# Patient Record
Sex: Female | Born: 1980 | Race: White | Hispanic: Yes | Marital: Single | Smoking: Never smoker
Health system: Southern US, Community
[De-identification: ages and names within clinical notes are randomized; demographics above are authoritative.]

---

## 2008-11-19 ENCOUNTER — Inpatient Hospital Stay (HOSPITAL_COMMUNITY): Admission: AD | Admit: 2008-11-19 | Discharge: 2008-11-20 | Payer: Self-pay | Admitting: Obstetrics & Gynecology

## 2011-09-05 LAB — CBC
HCT: 31.3 % — ABNORMAL LOW (ref 36.0–46.0)
Hemoglobin: 11.6 g/dL — ABNORMAL LOW (ref 12.0–15.0)
MCHC: 33.2 g/dL (ref 30.0–36.0)
MCV: 82.2 fL (ref 78.0–100.0)
MCV: 82.7 fL (ref 78.0–100.0)
Platelets: 222 10*3/uL (ref 150–400)
RBC: 4.23 MIL/uL (ref 3.87–5.11)
WBC: 12.5 10*3/uL — ABNORMAL HIGH (ref 4.0–10.5)

## 2013-06-21 LAB — OB RESULTS CONSOLE RUBELLA ANTIBODY, IGM: Rubella: IMMUNE

## 2013-06-21 LAB — OB RESULTS CONSOLE RPR: RPR: NONREACTIVE

## 2013-06-21 LAB — OB RESULTS CONSOLE HIV ANTIBODY (ROUTINE TESTING): HIV: NONREACTIVE

## 2013-06-21 LAB — OB RESULTS CONSOLE ABO/RH: RH TYPE: POSITIVE

## 2013-06-21 LAB — OB RESULTS CONSOLE ANTIBODY SCREEN: ANTIBODY SCREEN: NEGATIVE

## 2013-06-21 LAB — CYTOLOGY - PAP: Pap: NEGATIVE

## 2013-06-21 LAB — OB RESULTS CONSOLE GC/CHLAMYDIA
CHLAMYDIA, DNA PROBE: NEGATIVE
GC PROBE AMP, GENITAL: NEGATIVE

## 2013-06-21 LAB — OB RESULTS CONSOLE HEPATITIS B SURFACE ANTIGEN: HEP B S AG: NEGATIVE

## 2013-12-01 NOTE — L&D Delivery Note (Signed)
Delivery Note At 4:09 PM a viable female was delivered via Vaginal, Spontaneous Delivery (Presentation: Left Occiput Anterior).  APGAR: 9, 9; weight .   Placenta status: , .  Cord: 3 vessels with the following complications: None.  Cord pH: not done  Anesthesia: None  Episiotomy: None Lacerations: 2nd degree Suture Repair: 2.0 vicryl Est. Blood Loss (mL):   Mom to postpartum.  Baby to Couplet care / Skin to Skin.  Anisah Kuck A 01/27/2014, 4:34 PM

## 2013-12-20 LAB — OB RESULTS CONSOLE GC/CHLAMYDIA
CHLAMYDIA, DNA PROBE: NEGATIVE
Gonorrhea: NEGATIVE

## 2013-12-20 LAB — OB RESULTS CONSOLE GBS: STREP GROUP B AG: NEGATIVE

## 2014-01-13 ENCOUNTER — Other Ambulatory Visit (HOSPITAL_COMMUNITY): Payer: Self-pay | Admitting: Obstetrics

## 2014-01-13 ENCOUNTER — Encounter (HOSPITAL_COMMUNITY): Payer: Self-pay

## 2014-01-13 ENCOUNTER — Ambulatory Visit (HOSPITAL_COMMUNITY)
Admission: RE | Admit: 2014-01-13 | Discharge: 2014-01-13 | Disposition: A | Discharge status: Home / Self Care | Payer: Self-pay | Source: Ambulatory Visit | Attending: Obstetrics | Admitting: Obstetrics

## 2014-01-13 DIAGNOSIS — Z111 Encounter for screening for respiratory tuberculosis: Secondary | ICD-10-CM

## 2014-01-13 DIAGNOSIS — R7611 Nonspecific reaction to tuberculin skin test without active tuberculosis: Secondary | ICD-10-CM | POA: Insufficient documentation

## 2014-01-24 ENCOUNTER — Telehealth (HOSPITAL_COMMUNITY): Payer: Self-pay | Admitting: *Deleted

## 2014-01-24 ENCOUNTER — Encounter (HOSPITAL_COMMUNITY): Payer: Self-pay | Admitting: *Deleted

## 2014-01-24 NOTE — Telephone Encounter (Signed)
Preadmission screen Interpreter number [phone]

## 2014-01-27 ENCOUNTER — Inpatient Hospital Stay (HOSPITAL_COMMUNITY)
Admission: RE | Admit: 2014-01-27 | Discharge: 2014-01-28 | DRG: 775 | Disposition: A | Discharge status: Home / Self Care | Payer: Medicaid Other | Source: Ambulatory Visit | Attending: Obstetrics | Admitting: Obstetrics

## 2014-01-27 ENCOUNTER — Encounter (HOSPITAL_COMMUNITY): Payer: Self-pay

## 2014-01-27 DIAGNOSIS — Z3490 Encounter for supervision of normal pregnancy, unspecified, unspecified trimester: Secondary | ICD-10-CM

## 2014-01-27 LAB — RPR: RPR Ser Ql: NONREACTIVE

## 2014-01-27 LAB — CBC
HCT: 32.3 % — ABNORMAL LOW (ref 36.0–46.0)
HEMOGLOBIN: 10.5 g/dL — AB (ref 12.0–15.0)
MCH: 24 pg — ABNORMAL LOW (ref 26.0–34.0)
MCHC: 32.5 g/dL (ref 30.0–36.0)
MCV: 73.9 fL — ABNORMAL LOW (ref 78.0–100.0)
PLATELETS: 269 10*3/uL (ref 150–400)
RBC: 4.37 MIL/uL (ref 3.87–5.11)
RDW: 15.5 % (ref 11.5–15.5)
WBC: 8.1 10*3/uL (ref 4.0–10.5)

## 2014-01-27 LAB — TYPE AND SCREEN
ABO/RH(D): A POS
ANTIBODY SCREEN: NEGATIVE

## 2014-01-27 LAB — ABO/RH: ABO/RH(D): A POS

## 2014-01-27 MED ORDER — TETANUS-DIPHTH-ACELL PERTUSSIS 5-2.5-18.5 LF-MCG/0.5 IM SUSP
0.5000 mL | Freq: Once | INTRAMUSCULAR | Status: AC
  Administered 2014-01-28: 0.5 mL via INTRAMUSCULAR
  Filled 2014-01-27: qty 0.5

## 2014-01-27 MED ORDER — ACETAMINOPHEN 325 MG PO TABS: 650.0000 mg | ORAL_TABLET | ORAL | Status: DC | PRN

## 2014-01-27 MED ORDER — SENNOSIDES-DOCUSATE SODIUM 8.6-50 MG PO TABS
2.0000 | ORAL_TABLET | ORAL | Status: DC
  Administered 2014-01-27: 2 via ORAL
  Filled 2014-01-27: qty 2

## 2014-01-27 MED ORDER — ZOLPIDEM TARTRATE 5 MG PO TABS: 5.0000 mg | ORAL_TABLET | Freq: Every evening | ORAL | Status: DC | PRN

## 2014-01-27 MED ORDER — IBUPROFEN 600 MG PO TABS
600.0000 mg | ORAL_TABLET | Freq: Four times a day (QID) | ORAL | Status: DC
  Administered 2014-01-27 – 2014-01-28 (×3): 600 mg via ORAL
  Filled 2014-01-27 (×3): qty 1

## 2014-01-27 MED ORDER — SIMETHICONE 80 MG PO CHEW: 80.0000 mg | CHEWABLE_TABLET | ORAL | Status: DC | PRN

## 2014-01-27 MED ORDER — TERBUTALINE SULFATE 1 MG/ML IJ SOLN: 0.2500 mg | Freq: Once | INTRAMUSCULAR | Status: DC | PRN

## 2014-01-27 MED ORDER — FERROUS SULFATE 325 (65 FE) MG PO TABS
325.0000 mg | ORAL_TABLET | Freq: Two times a day (BID) | ORAL | Status: DC
  Administered 2014-01-28 (×2): 325 mg via ORAL
  Filled 2014-01-27: qty 1

## 2014-01-27 MED ORDER — IBUPROFEN 600 MG PO TABS
600.0000 mg | ORAL_TABLET | Freq: Four times a day (QID) | ORAL | Status: DC | PRN
  Administered 2014-01-27: 600 mg via ORAL
  Filled 2014-01-27: qty 1

## 2014-01-27 MED ORDER — BUTORPHANOL TARTRATE 1 MG/ML IJ SOLN: 1.0000 mg | INTRAMUSCULAR | Status: DC | PRN

## 2014-01-27 MED ORDER — ONDANSETRON HCL 4 MG/2ML IJ SOLN: 4.0000 mg | Freq: Four times a day (QID) | INTRAMUSCULAR | Status: DC | PRN

## 2014-01-27 MED ORDER — OXYTOCIN 40 UNITS IN LACTATED RINGERS INFUSION - SIMPLE MED
1.0000 m[IU]/min | INTRAVENOUS | Status: DC
  Administered 2014-01-27: 2 m[IU]/min via INTRAVENOUS
  Filled 2014-01-27: qty 1000

## 2014-01-27 MED ORDER — CITRIC ACID-SODIUM CITRATE 334-500 MG/5ML PO SOLN: 30.0000 mL | ORAL | Status: DC | PRN

## 2014-01-27 MED ORDER — OXYTOCIN 40 UNITS IN LACTATED RINGERS INFUSION - SIMPLE MED: 62.5000 mL/h | INTRAVENOUS | Status: DC

## 2014-01-27 MED ORDER — ONDANSETRON HCL 4 MG/2ML IJ SOLN: 4.0000 mg | INTRAMUSCULAR | Status: DC | PRN

## 2014-01-27 MED ORDER — OXYTOCIN BOLUS FROM INFUSION: 500.0000 mL | INTRAVENOUS | Status: DC

## 2014-01-27 MED ORDER — LACTATED RINGERS IV SOLN
INTRAVENOUS | Status: DC
  Administered 2014-01-27 (×2): via INTRAVENOUS

## 2014-01-27 MED ORDER — INFLUENZA VAC SPLIT QUAD 0.5 ML IM SUSP
0.5000 mL | INTRAMUSCULAR | Status: AC
  Administered 2014-01-28: 0.5 mL via INTRAMUSCULAR

## 2014-01-27 MED ORDER — DIPHENHYDRAMINE HCL 25 MG PO CAPS: 25.0000 mg | ORAL_CAPSULE | Freq: Four times a day (QID) | ORAL | Status: DC | PRN

## 2014-01-27 MED ORDER — PRENATAL MULTIVITAMIN CH
1.0000 | ORAL_TABLET | Freq: Every day | ORAL | Status: DC
Start: 2014-01-28 — End: 2014-01-28
  Administered 2014-01-28: 1 via ORAL
  Filled 2014-01-27: qty 1

## 2014-01-27 MED ORDER — DIBUCAINE 1 % RE OINT: 1.0000 "application " | TOPICAL_OINTMENT | RECTAL | Status: DC | PRN

## 2014-01-27 MED ORDER — BENZOCAINE-MENTHOL 20-0.5 % EX AERO
1.0000 "application " | INHALATION_SPRAY | CUTANEOUS | Status: DC | PRN
  Administered 2014-01-27: 1 via TOPICAL
  Filled 2014-01-27: qty 56

## 2014-01-27 MED ORDER — SODIUM CHLORIDE 0.9 % IJ SOLN: 3.0000 mL | INTRAMUSCULAR | Status: DC | PRN

## 2014-01-27 MED ORDER — WITCH HAZEL-GLYCERIN EX PADS: 1.0000 "application " | MEDICATED_PAD | CUTANEOUS | Status: DC | PRN

## 2014-01-27 MED ORDER — LIDOCAINE HCL (PF) 1 % IJ SOLN
30.0000 mL | INTRAMUSCULAR | Status: DC | PRN
  Administered 2014-01-27: 30 mL via SUBCUTANEOUS
  Filled 2014-01-27: qty 30

## 2014-01-27 MED ORDER — OXYCODONE-ACETAMINOPHEN 5-325 MG PO TABS
1.0000 | ORAL_TABLET | ORAL | Status: DC | PRN
  Administered 2014-01-27: 1 via ORAL
  Administered 2014-01-28: 2 via ORAL
  Filled 2014-01-27: qty 2
  Filled 2014-01-27: qty 1

## 2014-01-27 MED ORDER — ONDANSETRON HCL 4 MG PO TABS
4.0000 mg | ORAL_TABLET | ORAL | Status: DC | PRN
  Administered 2014-01-28: 4 mg via ORAL
  Filled 2014-01-27: qty 1

## 2014-01-27 MED ORDER — LACTATED RINGERS IV SOLN: 500.0000 mL | INTRAVENOUS | Status: DC | PRN

## 2014-01-27 MED ORDER — LANOLIN HYDROUS EX OINT: TOPICAL_OINTMENT | CUTANEOUS | Status: DC | PRN

## 2014-01-27 MED ORDER — OXYCODONE-ACETAMINOPHEN 5-325 MG PO TABS
1.0000 | ORAL_TABLET | ORAL | Status: DC | PRN
  Administered 2014-01-27: 1 via ORAL
  Filled 2014-01-27: qty 1

## 2014-01-27 MED ORDER — FLEET ENEMA 7-19 GM/118ML RE ENEM: 1.0000 | ENEMA | RECTAL | Status: DC | PRN

## 2014-01-27 NOTE — H&P (Signed)
This is Dr. Bernard Marshall dictating the hisFrancoise Murray and physical on  Shelly Murray she's a 33 year old gravida 3 para 2 all to at 40 weeks and 6 [facility] 01/21/1950 [facility] G[facility] admitted in for induction today cervix is 2 cm him in rapid progress a had a normal vaginal delivery of a female Apgar 3899 a second-degree perineal and the placenta was spontaneous Past medical history negative Past surgical history negative Social history negative System review noncontributory Physical exam well-developed female in no distress Lungs clear to P&A Heart regular rhythm no murmurs no gallops Breasts negative Abdomen uterus 20 week postpartum signs Extremities negative and

## 2014-01-28 LAB — CBC
HEMATOCRIT: 31.3 % — AB (ref 36.0–46.0)
HEMOGLOBIN: 10 g/dL — AB (ref 12.0–15.0)
MCH: 23.7 pg — AB (ref 26.0–34.0)
MCHC: 31.9 g/dL (ref 30.0–36.0)
MCV: 74.2 fL — ABNORMAL LOW (ref 78.0–100.0)
Platelets: 255 10*3/uL (ref 150–400)
RBC: 4.22 MIL/uL (ref 3.87–5.11)
RDW: 15.7 % — ABNORMAL HIGH (ref 11.5–15.5)
WBC: 15.3 10*3/uL — ABNORMAL HIGH (ref 4.0–10.5)

## 2014-01-28 NOTE — Lactation Note (Signed)
This note was copied from the chart of Shelly Murray. Lactation Consultation Note Initial consult:  [name] 3023 hours old.  Experienced BF mother for 1 and 2 years. In house interpreter unable to be present so [employer] Interpreter called.  Baby has been mostly formula fed and has not breastfed since this morning.  Described supply and demand and encouraged mother to her to put baby to the breast 8-12 times a day. Mother states she has been taught hand expression by her nurse.  Reviewed basics, feeding cues, cluster feeding, engorgement care, lactation support services and brochure.  Mother denies pain or problems or questions.  Encouraged mother to call if assistance is needed.  Patient Name: Shelly Murray [id number]'s Date: 01/28/2014 Reason for consult: Initial assessment   Maternal Data Has patient been taught Hand Expression?: Yes Does the patient have breastfeeding experience prior to this delivery?: Yes  Feeding    LATCH Score/Interventions                      Lactation Tools Discussed/Used     Consult Status Consult Status: PRN    Hardie PulleyBerkelhammer, Ruth Boschen 01/28/2014, 3:11 PM

## 2014-01-28 NOTE — Discharge Summary (Signed)
Obstetric Discharge Summary Reason for Admission: induction of labor Prenatal Procedures: none Intrapartum Procedures: spontaneous vaginal delivery Postpartum Procedures: none Complications-Operative and Postpartum: none Hemoglobin  Date Value Ref Range Status  01/28/2014 10.0* 12.0 - 15.0 g/dL Final     HCT  Date Value Ref Range Status  01/28/2014 31.3* 36.0 - 46.0 % Final    Physical Exam:  General: alert Lochia: appropriate Uterine Fundus: firm Incision: healing well DVT Evaluation: No evidence of DVT seen on physical exam.  Discharge Diagnoses: Term Pregnancy-delivered  Discharge Information: Date: 01/28/2014 Activity: pelvic rest Diet: routine Medications: Percocet Condition: stable Instructions: refer to practice specific booklet Discharge to: home Follow-up Information   Follow up with Kathreen CosierMARSHALL,BERNARD A, MD.   Specialty:  Obstetrics and Gynecology   Contact information:   [address] [place] [address] [phone]       Newborn Data: Live born female  Birth Weight: 7 lb 10.2 oz (3464 g) APGAR: 9, 9  Home with mother.  MARSHALL,BERNARD A 01/28/2014, 7:48 AM

## 2014-01-28 NOTE — Discharge Instructions (Signed)
Discharge instructions ° °· You can wash your hair °· Shower °· Eat what you want °· Drink what you want °· See me in 6 weeks °· Your ankles are going to swell more in the next 2 weeks than when pregnant °· No sex for 6 weeks ° ° °Serafin Decatur A, MD 01/28/2014 ° ° °

## 2014-01-30 NOTE — Progress Notes (Signed)
Ur chart review completed.  

## 2014-02-03 ENCOUNTER — Encounter (HOSPITAL_COMMUNITY): Payer: Self-pay | Admitting: Emergency Medicine

## 2014-02-03 ENCOUNTER — Emergency Department (HOSPITAL_COMMUNITY)
Admission: EM | Admit: 2014-02-03 | Discharge: 2014-02-03 | Disposition: A | Discharge status: Home / Self Care | Payer: Medicaid Other | Attending: Emergency Medicine | Admitting: Emergency Medicine

## 2014-02-03 DIAGNOSIS — R300 Dysuria: Secondary | ICD-10-CM | POA: Insufficient documentation

## 2014-02-03 DIAGNOSIS — O909 Complication of the puerperium, unspecified: Principal | ICD-10-CM

## 2014-02-03 DIAGNOSIS — K5900 Constipation, unspecified: Secondary | ICD-10-CM | POA: Insufficient documentation

## 2014-02-03 DIAGNOSIS — R339 Retention of urine, unspecified: Secondary | ICD-10-CM | POA: Insufficient documentation

## 2014-02-03 DIAGNOSIS — O26899 Other specified pregnancy related conditions, unspecified trimester: Secondary | ICD-10-CM | POA: Insufficient documentation

## 2014-02-03 MED ORDER — POLYETHYLENE GLYCOL 3350 17 G PO PACK: 17.0000 g | PACK | Freq: Every day | ORAL | Status: AC

## 2014-02-03 MED ORDER — POLYETHYLENE GLYCOL 3350 17 G PO PACK
17.0000 g | PACK | Freq: Every day | ORAL | Status: DC
  Administered 2014-02-03: 17 g via ORAL
  Filled 2014-02-03 (×2): qty 1

## 2014-02-03 MED ORDER — ACETAMINOPHEN 325 MG PO TABS
650.0000 mg | ORAL_TABLET | Freq: Once | ORAL | Status: AC
  Administered 2014-02-03: 650 mg via ORAL
  Filled 2014-02-03: qty 2

## 2014-02-03 MED ORDER — FLEET ENEMA 7-19 GM/118ML RE ENEM
1.0000 | ENEMA | Freq: Once | RECTAL | Status: AC
  Administered 2014-02-03: 1 via RECTAL
  Filled 2014-02-03: qty 1

## 2014-02-03 MED ORDER — LIDOCAINE HCL 2 % EX GEL
Freq: Once | CUTANEOUS | Status: AC
  Administered 2014-02-03: 07:00:00
  Filled 2014-02-03: qty 10

## 2014-02-03 NOTE — ED Notes (Signed)
Patient states she has been unable to have a BM for 3 days, and has been unable to urinate x2 days. Patient states the only medication she is taking is ibuprofen.

## 2014-02-03 NOTE — ED Notes (Signed)
Pt Voided urine in bed, Ambulated to restroom for defecation

## 2014-02-03 NOTE — ED Notes (Signed)
Patient escorted to restroom. Patient able to void.

## 2014-02-03 NOTE — ED Provider Notes (Signed)
Medical screening examination/treatment/procedure(s) were performed by non-physician practitioner and as supervising physician I was immediately available for consultation/collaboration.   EKG Interpretation None       Olivia Mackielga M Ezell Poke, MD 02/03/14 [phone]

## 2014-02-03 NOTE — Progress Notes (Signed)
[facility] spoke with patient about resources in the community to help pt establish primary care. Patient stated that she was pending Medicaid.

## 2014-02-03 NOTE — Discharge Instructions (Signed)
Constipación - Adulto   (Constipation, Adult)   Constipación significa que una persona tiene menos de 3 evacuaciones en una semana, hay dificultad para evacuar el intestino, o las heces son secas, duras, o más grandes que lo normal. A medida que envejecemos el estreñimiento es más común. Si intenta curar el estreñimiento con medicamentos que producen la evacuación intestinal (laxantes), el problema puede empeorar. El uso prolongado de laxantes puede hacer que los músculos del colon se debiliten. Una dieta baja en fibra, no tomar suficientes líquidos y el uso de ciertos medicamentos pueden empeorar el estreñimiento.   CAUSAS   · Ciertos medicamentos, como los antidepresivos, analgésicos, suplementos de hierro, antiácidos y diuréticos.    · Algunas enfermedades, como la diabetes, el síndrome del colon irritable (SII), enfermedad de la tiroides, o depresión.    · No beber suficiente agua.    · No consumir suficientes alimentos ricos en fibra.    · Situaciones de estrés o viajes.  · Falta de actividad física o de ejercicio.  · No ir al baño cuando siente la necesidad.  · Ignorar la necesidad súbita de mover el intestino.  · Uso en exceso de laxantes.  SÍNTOMAS   · Evacuar el intestino menos de 3 veces a la semana.    · Dificultad para mover el intestino    · Tener las heces secas y duras, o más grandes que las normales.    · Sensación de estar lleno o distendido.    · Dolor en la parte baja del abdomen  · No se siente alivio después de evacuar el intestino.  DIAGNÓSTICO   El médico le hará una historia clínica y le hará un examen físico. Pueden hacerle exámenes adicionales para el estreñimiento grave. Algunas pruebas son:   · Un radiografía con enema de bario para examinar el recto, el colon y en algunos casos el intestino delgado.  · Una sigmoidoscopia para examinar el colon inferior.  · Una colonoscopia para examinar todo el colon.  TRATAMIENTO   El tratamiento dependerá de la gravedad de la constipación y de la  causa. Algunos tratamientos dietéticos son beber más líquidos y comer más alimentos ricos en fibra. El cambio en el estilo de vida incluye hacer ejercicios de manera regular. Si estas recomendaciones para realizar cambios en la dieta y en el estilo de vida no ayudan, el médico le puede indicar el uso de laxantes de venta libre para favorecer el movimiento intestinal. Los medicamentos con receta se pueden prescribir si los medicamentos de venta libre no lo mejoran.   INSTRUCCIONES PARA EL CUIDADO EN EL HOGAR   · Aumente el consumo de alimentos con fibra, como frutas, verduras, granos enteros y frijoles. Limite los azúcares ricos en grasas y procesados   en su dieta, tales como papas fritas, hamburguesas, galletas, dulces y refrescos.    · Puede agregar un suplemento de fibra a su dieta si no obtiene lo suficiente de los alimentos.    · Debe ingerir gran cantidad de líquido para mantener la orina de tono claro o color amarillo pálido.    · Haga ejercicios regularmente o según las indicaciones de su médico.    · Vaya al baño cuando sienta la necesidad de ir. No espere.  · Tome sólo la medicación que le indicó el profesional.  No tome otros medicamentos para la constipación sin consultar a su médico.  SOLICITE ATENCIÓN MÉDICA DE INMEDIATO SI:   · Observa sangre brillante en las heces.    · La constipación dura más de 4 días o empeora.    · Siente   dolor abdominal o rectal.    · Las heces son delgadas como un lápiz.  · Pierde peso de manera inexplicable.  ASEGÚRESE DE QUE:   · Comprende estas instrucciones.  · Controlará su enfermedad.  · Solicitará ayuda de inmediato si no mejora o empeora.  Document Released: 12/07/2007 Document Revised: 05/18/2012  Exit[employer]® Patient Information ©2014 [employer].

## 2014-02-03 NOTE — ED Provider Notes (Signed)
CSN: [id number]     Arrival date & time 02/03/14  0540 History   First MD Initiated Contact with Patient 02/03/14 [phone]     Chief Complaint  Patient presents with  . Constipation    x3 days     (Consider location/radiation/quality/duration/timing/severity/associated sxs/prior Treatment) HPI  33 year old Spanish speaking female 1 week postpartum who presents for complaints of lack of bowel movement and unable to urinate for the past several days. Hx obtain through son who is at bedside.  Patient is a G3 P3 with recent normal vaginal delivery without any complication. Patient has a small bowel movement 2 days after giving birth, none since. She has the urge to have a bowel movement but felt constipated. She complains of crampy abdominal pain worse with palpation to the abdomen. She also has the urge to urinate but has urinate minimally.  She did produce a small amount of urine while in ER.  Otherwise patient denies fever, chills, vomiting, burning with urination. She denies receiving epidural for her pregnancy.  She did not have similar sxs with prior birth.     History reviewed. No pertinent past medical history. History reviewed. No pertinent past surgical history. Family History  Problem Relation Age of Onset  . Asthma Daughter   . Alcohol abuse Neg Hx   . Arthritis Neg Hx   . Birth defects Neg Hx   . Cancer Neg Hx   . COPD Neg Hx   . Depression Neg Hx   . Diabetes Neg Hx   . Drug abuse Neg Hx   . Early death Neg Hx   . Hearing loss Neg Hx   . Heart disease Neg Hx   . Hyperlipidemia Neg Hx   . Hypertension Neg Hx   . Kidney disease Neg Hx   . Learning disabilities Neg Hx   . Mental illness Neg Hx   . Mental retardation Neg Hx   . Miscarriages / Stillbirths Neg Hx   . Stroke Neg Hx   . Vision loss Neg Hx    History  Substance Use Topics  . Smoking status: Never Smoker   . Smokeless tobacco: Never Used  . Alcohol Use: No   OB History   Grav Para Term Preterm Abortions TAB  SAB Ect Mult Living   3 3 3       3      Review of Systems  Gastrointestinal: Positive for abdominal pain and constipation. Negative for blood in stool, anal bleeding and rectal pain.  Genitourinary: Positive for difficulty urinating.  Skin: Negative for rash and wound.  All other systems reviewed and are negative.      Allergies  Review of patient's allergies indicates no known allergies.  Home Medications   Current Outpatient Rx  Name  Route  Sig  Dispense  Refill  . ibuprofen (ADVIL,MOTRIN) 200 MG tablet   Oral   Take 600 mg by mouth every 6 (six) hours as needed for mild pain.          BP 116/69  Pulse 100  Temp(Src) 98.1 F (36.7 C) (Oral)  Resp 18  Ht 5' (1.524 m)  Wt 153 lb (69.4 kg)  BMI 29.88 kg/m2  SpO2 100% Physical Exam  Nursing note and vitals reviewed. Constitutional: She appears well-developed and well-nourished. No distress.  HENT:  Head: Atraumatic.  Eyes: Conjunctivae are normal.  Neck: Neck supple.  Abdominal: Soft. There is tenderness (suprapubic and LLQ abd tenderness, mild, without guarding or rebound tenderness.  no  hernia noted).  Genitourinary:  Chaperone present: large stool burden in rectal vault, soft but impacted.    Neurological: She is alert.  Skin: No rash noted.  Psychiatric: She has a normal mood and affect.    ED Course  Fecal disimpaction Date/Time: 02/03/2014 7:40 AM Performed by: Fayrene Helper Authorized by: Fayrene Helper Consent: Verbal consent obtained. Risks and benefits: risks, benefits and alternatives were discussed Consent given by: patient Patient understanding: patient states understanding of the procedure being performed Patient identity confirmed: verbally with patient and arm band Local anesthesia used: yes Local anesthetic: topical anesthetic Anesthetic total: 4 ml Patient tolerance: Patient tolerated the procedure well with no immediate complications. Comments: Manual disimpaction with removal of  approximately 1 egg size ball of stool.     (including critical care time)  6:26 AM Pt hasn't had BM in 3 days and unable to urinate x 2 days.  Does have large stool burden in rectal vault, and now having urge to have BM.  She was able to produce some urine while being in ER.    7:40 AM Manual disimpaction performed by me to help aid BM.  Miralax given.    8:27 AM Patient was able to urinate in the ED.  9:39 AM Patient had a large bowel movement and also able to urinate completely. Patient states she felt much better. She does have some mild vaginal bleeding but this has been residual from her recent vaginal delivery less than a week ago. No new bleeding. Patient is currently stable for discharge. Return precautions discussed.  Labs Review Labs Reviewed - No data to display Imaging Review No results found.   EKG Interpretation None      MDM   Final diagnoses:  Constipation  Urinary retention    BP 116/69  Pulse 100  Temp(Src) 98.1 F (36.7 C) (Oral)  Resp 18  Ht 5' (1.524 m)  Wt 153 lb (69.4 kg)  BMI 29.88 kg/m2  SpO2 100%     Fayrene Helper, PA-C 02/03/14 [phone]

## 2014-02-03 NOTE — ED Notes (Signed)
Per EMS, patient c/o constipation x3 days, denies abd pain, nausea, and vomiting. Patient delivered a baby @ 1 week ago, minimal bleeding since then. Patient does not speak [place].

## 2014-10-02 ENCOUNTER — Encounter (HOSPITAL_COMMUNITY): Payer: Self-pay | Admitting: Emergency Medicine

## 2015-06-19 IMAGING — CR DG CHEST 1V
1 series · 1 of 1 positions shown · non-contrast
Comparison: None.

CLINICAL DATA: Asymptomatic positive PPD.

EXAM:
CHEST - 1 VIEW

[view not recorded]
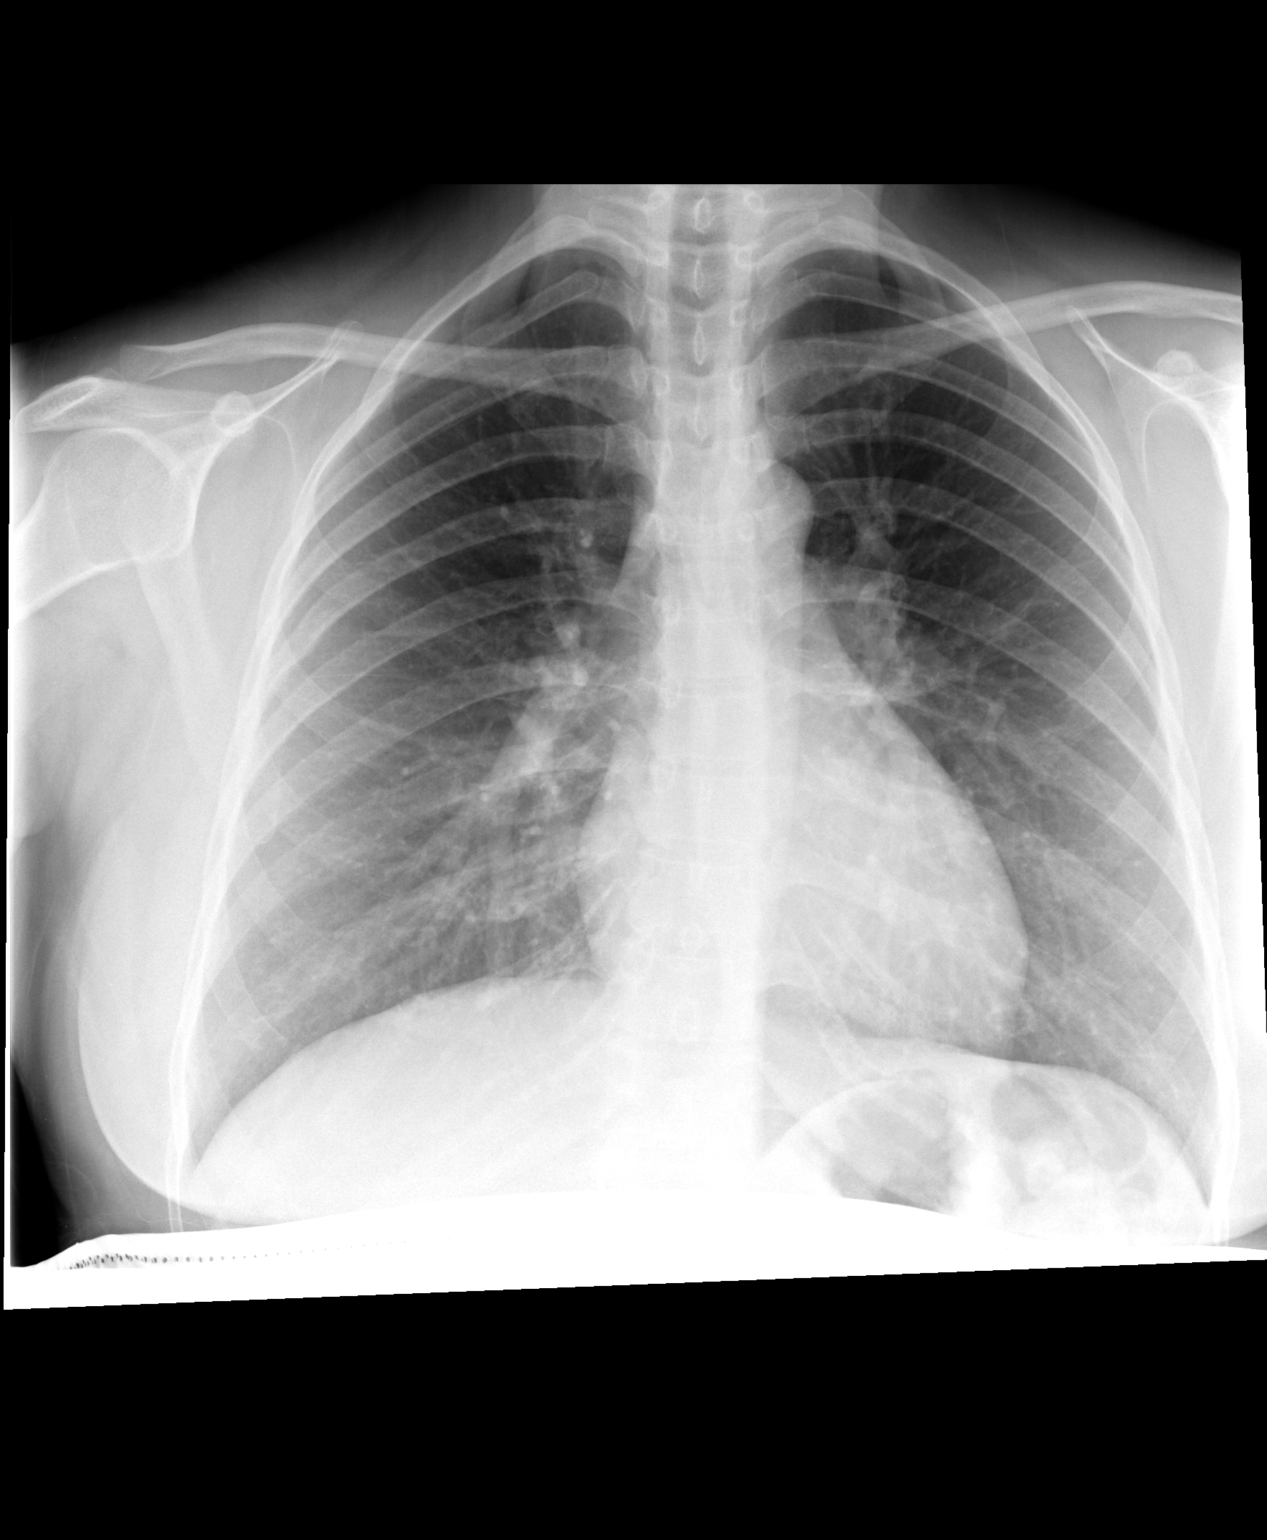

[1 of 1 positions shown; findings below may reference images not displayed]

FINDINGS: Cardiomediastinal silhouette unremarkable. Lungs clear.
Bronchovascular markings normal. Pulmonary vascularity normal. No
pneumothorax. No visible pleural effusions.
IMPRESSION: Normal examination.
